# Patient Record
Sex: Female | Born: 1945 | Race: Black or African American | Hispanic: No | Marital: Married | Smoking: Never smoker
Health system: Southern US, Community
[De-identification: ages and names within clinical notes are randomized; demographics above are authoritative.]

## PROBLEM LIST (undated history)

## (undated) DIAGNOSIS — J449 Chronic obstructive pulmonary disease, unspecified: Secondary | ICD-10-CM

## (undated) DIAGNOSIS — Z9981 Dependence on supplemental oxygen: Secondary | ICD-10-CM

## (undated) DIAGNOSIS — E7800 Pure hypercholesterolemia, unspecified: Secondary | ICD-10-CM

## (undated) DIAGNOSIS — M109 Gout, unspecified: Secondary | ICD-10-CM

## (undated) DIAGNOSIS — K219 Gastro-esophageal reflux disease without esophagitis: Secondary | ICD-10-CM

## (undated) DIAGNOSIS — I10 Essential (primary) hypertension: Secondary | ICD-10-CM

## (undated) HISTORY — PX: CORNEAL TRANSPLANT: SHX108

## (undated) HISTORY — PX: SMALL INTESTINE SURGERY: SHX150

---

## 2012-05-30 ENCOUNTER — Encounter (HOSPITAL_BASED_OUTPATIENT_CLINIC_OR_DEPARTMENT_OTHER): Payer: Self-pay | Admitting: *Deleted

## 2012-05-30 ENCOUNTER — Emergency Department (HOSPITAL_BASED_OUTPATIENT_CLINIC_OR_DEPARTMENT_OTHER)
Admission: EM | Admit: 2012-05-30 | Discharge: 2012-05-30 | Disposition: A | Discharge status: Other Acute Inpatient Hospital | Payer: Medicare Other | Attending: Emergency Medicine | Admitting: Emergency Medicine

## 2012-05-30 ENCOUNTER — Emergency Department (HOSPITAL_BASED_OUTPATIENT_CLINIC_OR_DEPARTMENT_OTHER): Payer: Medicare Other

## 2012-05-30 DIAGNOSIS — R079 Chest pain, unspecified: Secondary | ICD-10-CM | POA: Insufficient documentation

## 2012-05-30 DIAGNOSIS — R059 Cough, unspecified: Secondary | ICD-10-CM | POA: Insufficient documentation

## 2012-05-30 DIAGNOSIS — J449 Chronic obstructive pulmonary disease, unspecified: Secondary | ICD-10-CM

## 2012-05-30 DIAGNOSIS — J441 Chronic obstructive pulmonary disease with (acute) exacerbation: Secondary | ICD-10-CM | POA: Insufficient documentation

## 2012-05-30 DIAGNOSIS — R0982 Postnasal drip: Secondary | ICD-10-CM | POA: Insufficient documentation

## 2012-05-30 DIAGNOSIS — R05 Cough: Secondary | ICD-10-CM | POA: Insufficient documentation

## 2012-05-30 DIAGNOSIS — I10 Essential (primary) hypertension: Secondary | ICD-10-CM | POA: Insufficient documentation

## 2012-05-30 DIAGNOSIS — Z79899 Other long term (current) drug therapy: Secondary | ICD-10-CM | POA: Insufficient documentation

## 2012-05-30 DIAGNOSIS — E7800 Pure hypercholesterolemia, unspecified: Secondary | ICD-10-CM | POA: Insufficient documentation

## 2012-05-30 DIAGNOSIS — Z9981 Dependence on supplemental oxygen: Secondary | ICD-10-CM | POA: Insufficient documentation

## 2012-05-30 DIAGNOSIS — J189 Pneumonia, unspecified organism: Secondary | ICD-10-CM

## 2012-05-30 DIAGNOSIS — R5381 Other malaise: Secondary | ICD-10-CM | POA: Insufficient documentation

## 2012-05-30 DIAGNOSIS — J3489 Other specified disorders of nose and nasal sinuses: Secondary | ICD-10-CM | POA: Insufficient documentation

## 2012-05-30 HISTORY — DX: Essential (primary) hypertension: I10

## 2012-05-30 HISTORY — DX: Gout, unspecified: M10.9

## 2012-05-30 HISTORY — DX: Pure hypercholesterolemia, unspecified: E78.00

## 2012-05-30 HISTORY — DX: Chronic obstructive pulmonary disease, unspecified: J44.9

## 2012-05-30 HISTORY — DX: Gastro-esophageal reflux disease without esophagitis: K21.9

## 2012-05-30 LAB — CBC WITH DIFFERENTIAL/PLATELET
Basophils Absolute: 0 10*3/uL (ref 0.0–0.1)
Lymphocytes Relative: 3 % — ABNORMAL LOW (ref 12–46)
Lymphs Abs: 0.5 10*3/uL — ABNORMAL LOW (ref 0.7–4.0)
Monocytes Relative: 4 % (ref 3–12)
Neutrophils Relative %: 87 % — ABNORMAL HIGH (ref 43–77)
Platelets: 366 10*3/uL (ref 150–400)
RBC: 4.49 MIL/uL (ref 3.87–5.11)
RDW: 15.4 % (ref 11.5–15.5)
WBC: 17.7 10*3/uL — ABNORMAL HIGH (ref 4.0–10.5)

## 2012-05-30 LAB — BASIC METABOLIC PANEL
Chloride: 99 mEq/L (ref 96–112)
Creatinine, Ser: 0.9 mg/dL (ref 0.50–1.10)
GFR calc Af Amer: 77 mL/min — ABNORMAL LOW (ref >90)
Potassium: 3.4 mEq/L — ABNORMAL LOW (ref 3.5–5.1)
Sodium: 144 mEq/L (ref 135–145)

## 2012-05-30 LAB — POCT I-STAT 3, ART BLOOD GAS (G3+)
Acid-Base Excess: 13 mmol/L — ABNORMAL HIGH (ref 0.0–2.0)
pH, Arterial: 7.462 — ABNORMAL HIGH (ref 7.350–7.450)
pO2, Arterial: 86 mmHg (ref 80.0–100.0)

## 2012-05-30 LAB — TROPONIN I: Troponin I: <0.3 ng/mL (ref <0.30)

## 2012-05-30 MED ORDER — LEVOFLOXACIN IN D5W 750 MG/150ML IV SOLN
750.0000 mg | Freq: Once | INTRAVENOUS | Status: AC
  Administered 2012-05-30: 750 mg via INTRAVENOUS
  Filled 2012-05-30: qty 150

## 2012-05-30 MED ORDER — IPRATROPIUM BROMIDE 0.02 % IN SOLN
0.5000 mg | Freq: Once | RESPIRATORY_TRACT | Status: AC
  Administered 2012-05-30: 0.5 mg via RESPIRATORY_TRACT
  Filled 2012-05-30: qty 2.5

## 2012-05-30 MED ORDER — METHYLPREDNISOLONE SODIUM SUCC 125 MG IJ SOLR
125.0000 mg | Freq: Once | INTRAMUSCULAR | Status: AC
  Administered 2012-05-30: 125 mg via INTRAVENOUS
  Filled 2012-05-30: qty 2

## 2012-05-30 MED ORDER — ALBUTEROL SULFATE (5 MG/ML) 0.5% IN NEBU
5.0000 mg | INHALATION_SOLUTION | Freq: Once | RESPIRATORY_TRACT | Status: AC
  Administered 2012-05-30: 5 mg via RESPIRATORY_TRACT
  Filled 2012-05-30: qty 1

## 2012-05-30 MED ORDER — SODIUM CHLORIDE 0.9 % IV SOLN
1000.0000 mL | INTRAVENOUS | Status: DC
  Administered 2012-05-30: 1000 mL via INTRAVENOUS

## 2012-05-30 NOTE — ED Provider Notes (Signed)
History     CSN: [id number]  Arrival date & time 05/30/12  1103   First MD Initiated Contact with Patient 05/30/12 1123      Chief Complaint  Patient presents with  . Respiratory Distress    (Consider location/radiation/quality/duration/timing/severity/associated sxs/prior treatment) HPI Comments: Patient with a history of COPD presents with respiratory distress. She states that she's been having some increased breathing difficulty over the last week has been worse over last 2 days. She's had some increased wheezing mostly at night. She's also had worsening cough with sputum productive of yellow and thick white sputum. She denies any fevers or chills. She states her center of her chest hurts when she's coughing but denies any other chest pain. She denies any pleuritic-type pain. She is on home oxygen at 2 L per minute. She also uses nebulizer machines at home which she has been using with no improvement of symptoms. She denies any leg pain or swelling. She denies any fevers or chills.   Past Medical History  Diagnosis Date  . Hypertension   . COPD (chronic obstructive pulmonary disease)   . High cholesterol     No past surgical history on file.  No family history on file.  History  Substance Use Topics  . Smoking status: Not on file  . Smokeless tobacco: Not on file  . Alcohol Use: Not on file    OB History   Grav Para Term Preterm Abortions TAB SAB Ect Mult Living                  Review of Systems  Constitutional: Positive for fatigue. Negative for fever, chills and diaphoresis.  HENT: Positive for congestion, rhinorrhea and postnasal drip. Negative for sneezing.   Eyes: Negative.   Respiratory: Positive for shortness of breath and wheezing. Negative for cough and chest tightness.   Cardiovascular: Positive for chest pain. Negative for leg swelling.  Gastrointestinal: Negative for nausea, vomiting, abdominal pain, diarrhea and blood in stool.  Genitourinary:  Negative for frequency, hematuria, flank pain and difficulty urinating.  Musculoskeletal: Negative for back pain and arthralgias.  Skin: Negative for rash.  Neurological: Negative for dizziness, speech difficulty, weakness, numbness and headaches.    Allergies  Sulfa antibiotics  Home Medications   Current Outpatient Rx  Name  Route  Sig  Dispense  Refill  . ALLOPURINOL PO   Oral   Take by mouth.         . ATORVASTATIN CALCIUM PO   Oral   Take by mouth.         . Budesonide-Formoterol Fumarate (SYMBICORT IN)   Inhalation   Inhale into the lungs.         Marland Kitchen CALCIUM PO   Oral   Take by mouth.         . cloNIDine (CATAPRES) 0.1 MG tablet   Oral   Take 0.1 mg by mouth 2 (two) times daily.         . furosemide (LASIX) 20 MG tablet   Oral   Take 20 mg by mouth 2 (two) times daily.         . montelukast (SINGULAIR) 10 MG tablet   Oral   Take 10 mg by mouth at bedtime.         . pantoprazole (PROTONIX) 40 MG tablet   Oral   Take 40 mg by mouth daily.         Marland Kitchen Phenylephrine-Chlorphen-DM 10-3.5-28 MG TABS   Oral  Take by mouth.         Marland Kitchen PREDNISONE PO   Oral   Take by mouth.           BP 172/84  Pulse 91  Temp(Src) 98.1 F (36.7 C) (Oral)  Resp 24  Ht 5\' 2"  (1.575 m)  Wt 155 lb (70.308 kg)  BMI 28.34 kg/m2  SpO2 95%  Physical Exam  Constitutional: She is oriented to person, place, and time. She appears well-developed and well-nourished.  HENT:  Head: Normocephalic and atraumatic.  Eyes: Pupils are equal, round, and reactive to light.  Neck: Normal range of motion. Neck supple.  Cardiovascular: Normal rate, regular rhythm and normal heart sounds.   Pulmonary/Chest: She is in respiratory distress (Some increased work of breathing and hypoxia.). She has wheezes (moderate diffuse expiratory wheezing with diminished breath sounds bilaterally). She has no rales. She exhibits no tenderness.  Abdominal: Soft. Bowel sounds are normal. There  is no tenderness. There is no rebound and no guarding.  Musculoskeletal: Normal range of motion. She exhibits no edema and no tenderness.  Lymphadenopathy:    She has no cervical adenopathy.  Neurological: She is alert and oriented to person, place, and time.  Skin: Skin is warm and dry. No rash noted.  Psychiatric: She has a normal mood and affect.    ED Course  Procedures (including critical care time)  Results for orders placed during the hospital encounter of 05/30/12  CBC WITH DIFFERENTIAL      Result Value Range   WBC 17.7 (*) 4.0 - 10.5 K/uL   RBC 4.49  3.87 - 5.11 MIL/uL   Hemoglobin 13.0  12.0 - 15.0 g/dL   HCT 09.8  11.9 - 14.7 %   MCV 91.8  78.0 - 100.0 fL   MCH 29.0  26.0 - 34.0 pg   MCHC 31.6  30.0 - 36.0 g/dL   RDW 82.9  56.2 - 13.0 %   Platelets 366  150 - 400 K/uL   Neutrophils Relative 87 (*) 43 - 77 %   Lymphocytes Relative 3 (*) 12 - 46 %   Monocytes Relative 4  3 - 12 %   Eosinophils Relative 6 (*) 0 - 5 %   Basophils Relative 0  0 - 1 %   Neutro Abs 15.4 (*) 1.7 - 7.7 K/uL   Lymphs Abs 0.5 (*) 0.7 - 4.0 K/uL   Monocytes Absolute 0.7  0.1 - 1.0 K/uL   Eosinophils Absolute 1.1 (*) 0.0 - 0.7 K/uL   Basophils Absolute 0.0  0.0 - 0.1 K/uL   RBC Morphology ELLIPTOCYTES    BASIC METABOLIC PANEL      Result Value Range   Sodium 144  135 - 145 mEq/L   Potassium 3.4 (*) 3.5 - 5.1 mEq/L   Chloride 99  96 - 112 mEq/L   CO2 37 (*) 19 - 32 mEq/L   Glucose, Bld 117 (*) 70 - 99 mg/dL   BUN 26 (*) 6 - 23 mg/dL   Creatinine, Ser 8.65  0.50 - 1.10 mg/dL   Calcium 78.4  8.4 - 69.6 mg/dL   GFR calc non Af Amer 66 (*) >90 mL/min   GFR calc Af Amer 77 (*) >90 mL/min   Dg Chest 2 View  05/30/2012  *RADIOLOGY REPORT*  Clinical Data: Cough, wheezing.  CHEST - 2 VIEW  Comparison: None  Findings: There is cardiomegaly.  Bilateral patchy airspace opacities are noted in the lungs.  Mild hyperinflation.  Small  right pleural effusion.  No acute bony abnormality.  IMPRESSION:  Cardiomegaly with patchy bilateral airspace disease, edema versus infection.  Suspect underlying COPD.  Small right effusion.   Original Report Authenticated By: Charlett Nose, M.D.       Date: 05/30/2012  Rate: 83  Rhythm: normal sinus rhythm  QRS Axis: normal  Intervals: normal  ST/T Wave abnormalities: nonspecific ST/T changes  Conduction Disutrbances:none  Narrative Interpretation:   Old EKG Reviewed: none available   1. Community acquired pneumonia   2. COPD (chronic obstructive pulmonary disease)       MDM  Patient was given a DuoNeb treatment. She was also given a dose of steroids. Her breathing is markedly improved and she has no increased work of breathing. She's talking in full sentences. She is maintaining oxygen saturations in the mid 90s on a nasal cannula she currently is on at home. She does have evidence of pneumonia on her chest x-ray was given a dose of Levaquin for community-acquired pneumonia. She has no recent hospitalizations. I do feel that given her underlying lung disease with bilateral pneumonia she should be admitted for IV antibiotics. Her choice is to go [facility] and I did consult with the hospitalist at that facility who will accept the patient for transfer.        Rolan Bucco, MD 05/30/12 1319

## 2012-05-30 NOTE — ED Notes (Signed)
Sob and diff breathing for a week but worse x 2 days. Cough with yellow sputum. She is on oxygen 2/l [place].

## 2012-11-03 ENCOUNTER — Emergency Department (HOSPITAL_BASED_OUTPATIENT_CLINIC_OR_DEPARTMENT_OTHER): Payer: Medicare Other

## 2012-11-03 ENCOUNTER — Encounter (HOSPITAL_BASED_OUTPATIENT_CLINIC_OR_DEPARTMENT_OTHER): Payer: Self-pay | Admitting: Student

## 2012-11-03 ENCOUNTER — Emergency Department (HOSPITAL_BASED_OUTPATIENT_CLINIC_OR_DEPARTMENT_OTHER)
Admission: EM | Admit: 2012-11-03 | Discharge: 2012-11-03 | Disposition: A | Discharge status: Home / Self Care | Payer: Medicare Other | Attending: Emergency Medicine | Admitting: Emergency Medicine

## 2012-11-03 DIAGNOSIS — Z79899 Other long term (current) drug therapy: Secondary | ICD-10-CM | POA: Insufficient documentation

## 2012-11-03 DIAGNOSIS — M109 Gout, unspecified: Secondary | ICD-10-CM | POA: Insufficient documentation

## 2012-11-03 DIAGNOSIS — J4489 Other specified chronic obstructive pulmonary disease: Secondary | ICD-10-CM | POA: Insufficient documentation

## 2012-11-03 DIAGNOSIS — K625 Hemorrhage of anus and rectum: Secondary | ICD-10-CM | POA: Insufficient documentation

## 2012-11-03 DIAGNOSIS — K219 Gastro-esophageal reflux disease without esophagitis: Secondary | ICD-10-CM | POA: Insufficient documentation

## 2012-11-03 DIAGNOSIS — I10 Essential (primary) hypertension: Secondary | ICD-10-CM | POA: Insufficient documentation

## 2012-11-03 DIAGNOSIS — J449 Chronic obstructive pulmonary disease, unspecified: Secondary | ICD-10-CM | POA: Insufficient documentation

## 2012-11-03 DIAGNOSIS — E7800 Pure hypercholesterolemia, unspecified: Secondary | ICD-10-CM | POA: Insufficient documentation

## 2012-11-03 LAB — OCCULT BLOOD X 1 CARD TO LAB, STOOL: Fecal Occult Bld: NEGATIVE

## 2012-11-03 LAB — BASIC METABOLIC PANEL
Chloride: 102 mEq/L (ref 96–112)
GFR calc Af Amer: 88 mL/min — ABNORMAL LOW (ref >90)
Potassium: 3.4 mEq/L — ABNORMAL LOW (ref 3.5–5.1)

## 2012-11-03 LAB — CBC
HCT: 31.9 % — ABNORMAL LOW (ref 36.0–46.0)
Platelets: 265 10*3/uL (ref 150–400)
RBC: 3.33 MIL/uL — ABNORMAL LOW (ref 3.87–5.11)
RDW: 14.4 % (ref 11.5–15.5)
WBC: 11 10*3/uL — ABNORMAL HIGH (ref 4.0–10.5)

## 2012-11-03 MED ORDER — IOHEXOL 300 MG/ML  SOLN
100.0000 mL | Freq: Once | INTRAMUSCULAR | Status: AC | PRN
  Administered 2012-11-03: 100 mL via INTRAVENOUS

## 2012-11-03 MED ORDER — IOHEXOL 300 MG/ML  SOLN
50.0000 mL | Freq: Once | INTRAMUSCULAR | Status: AC | PRN
  Administered 2012-11-03: 50 mL via ORAL

## 2012-11-03 NOTE — ED Notes (Signed)
Wheelchair to lobby.

## 2012-11-03 NOTE — ED Notes (Signed)
Bright red blood mixed in stool and reports next BM was bloody. Reports cramping in lower abdominal region.

## 2012-11-03 NOTE — ED Notes (Signed)
Pt oob to restroom with assist x 1. gait steady, ambulates well.

## 2012-11-03 NOTE — ED Provider Notes (Signed)
CSN: [id number]     Arrival date & time 11/03/12  1305 History   First MD Initiated Contact with Patient 11/03/12 1327     Chief Complaint  Patient presents with  . Rectal Bleeding   (Consider location/radiation/quality/duration/timing/severity/associated sxs/prior Treatment) Patient is a 67 y.o. female presenting with hematochezia. The history is provided by the patient.  Rectal Bleeding Quality:  Bright red Amount:  Moderate (2 episodes yesterday, one with small amount, one with moderate , all yesterday, none today) Timing:  Sporadic Progression:  Resolved Chronicity:  New Context: not constipation, not defecation, not foreign body, not hemorrhoids, not rectal injury, not rectal pain and not spontaneously   Similar prior episodes: no   Relieved by:  Nothing Worsened by:  Nothing tried Ineffective treatments:  None tried Associated symptoms: abdominal pain (mild. occasional, crampy)   Associated symptoms: no dizziness, no epistaxis, no fever and no vomiting   Risk factors: no anticoagulant use     Past Medical History  Diagnosis Date  . Hypertension   . COPD (chronic obstructive pulmonary disease)   . High cholesterol   . Gout   . GERD (gastroesophageal reflux disease)    History reviewed. No pertinent past surgical history. No family history on file. History  Substance Use Topics  . Smoking status: Never Smoker   . Smokeless tobacco: Not on file  . Alcohol Use: No   OB History   Grav Para Term Preterm Abortions TAB SAB Ect Mult Living                 Review of Systems  Constitutional: Negative for fever.  HENT: Negative for nosebleeds.   Respiratory: Negative for cough and shortness of breath.   Gastrointestinal: Positive for abdominal pain (mild. occasional, crampy) and hematochezia. Negative for vomiting.  Neurological: Negative for dizziness.  All other systems reviewed and are negative.    Allergies  Sulfa antibiotics  Home Medications   Current  Outpatient Rx  Name  Route  Sig  Dispense  Refill  . ALLOPURINOL PO   Oral   Take by mouth.         . ATORVASTATIN CALCIUM PO   Oral   Take by mouth.         . Budesonide-Formoterol Fumarate (SYMBICORT IN)   Inhalation   Inhale into the lungs.         Marland Kitchen CALCIUM PO   Oral   Take by mouth.         . cloNIDine (CATAPRES) 0.1 MG tablet   Oral   Take 0.1 mg by mouth 2 (two) times daily.         . furosemide (LASIX) 20 MG tablet   Oral   Take 20 mg by mouth 2 (two) times daily.         . montelukast (SINGULAIR) 10 MG tablet   Oral   Take 10 mg by mouth at bedtime.         . pantoprazole (PROTONIX) 40 MG tablet   Oral   Take 40 mg by mouth daily.         Marland Kitchen Phenylephrine-Chlorphen-DM 10-3.5-28 MG TABS   Oral   Take by mouth.         Marland Kitchen PREDNISONE PO   Oral   Take by mouth.          BP 166/72  Pulse 87  Temp(Src) 98.5 F (36.9 C) (Oral)  Resp 22  Wt 161 lb (73.029 kg)  BMI 29.44  kg/m2  SpO2 94% Physical Exam  Nursing note and vitals reviewed. Constitutional: She is oriented to person, place, and time. She appears well-developed and well-nourished. No distress.  HENT:  Head: Normocephalic and atraumatic.  Eyes: EOM are normal. Pupils are equal, round, and reactive to light.  Neck: Normal range of motion. Neck supple.  Cardiovascular: Normal rate and regular rhythm.  Exam reveals no friction rub.   No murmur heard. Pulmonary/Chest: Effort normal. No respiratory distress. She has wheezes (diffuse). She has no rales.  Abdominal: Soft. She exhibits no distension. There is no tenderness. There is no rebound.  Musculoskeletal: Normal range of motion. She exhibits no edema.  Neurological: She is alert and oriented to person, place, and time.  Skin: She is not diaphoretic.    ED Course  Procedures (including critical care time) Labs Review Labs Reviewed  CBC - Abnormal; Notable for the following:    WBC 11.0 (*)    RBC 3.33 (*)    Hemoglobin  9.8 (*)    HCT 31.9 (*)    All other components within normal limits  BASIC METABOLIC PANEL - Abnormal; Notable for the following:    Potassium 3.4 (*)    CO2 38 (*)    Glucose, Bld 135 (*)    GFR calc non Af Amer 76 (*)    GFR calc Af Amer 88 (*)    All other components within normal limits  OCCULT BLOOD X 1 CARD TO LAB, STOOL  CG4 I-STAT (LACTIC ACID)   Imaging Review Ct Abdomen Pelvis W Contrast  11/03/2012   CLINICAL DATA:  Bright red bleeding from the rectum. History of colon cancer. Abdominal pain and cramping.  EXAM: CT ABDOMEN AND PELVIS WITH CONTRAST  TECHNIQUE: Multidetector CT imaging of the abdomen and pelvis was performed using the standard protocol following bolus administration of intravenous contrast.  CONTRAST:  OMNIPAQUE IOHEXOL 300 MG/ML SOLN, 50mL OMNIPAQUE IOHEXOL 300 MG/ML SOLN  COMPARISON:  CT chest 05/31/2012.  FINDINGS: And bronchiectatic changes and micronodular disease is again noted at both lung bases. No new nodules or significant change is evident. Fibrotic changes are evident. The heart size is normal. No significant pleural or pericardial effusion is present.  The liver and spleen are within normal limits. The stomach, duodenum, and pancreas are within normal limits. The common bile duct and gallbladder are unremarkable. The adrenal glands are normal bilaterally. A nonobstructing 5.3 mm stone is evident at the lower pole of the right kidney. And the left kidney is unremarkable. The ureters are within normal limits bilaterally.  The rectosigmoid colon is within normal limits. Moderate stool is present in the transverse colon without focal obstruction. The ascending colon is unremarkable. The midtransverse colon is noted to the immediately adjacent to the ventral wall of the abdomen subjacent to the normal like is. The patient is status post midline laparotomy. The appendix is not visualized and may be surgically absent. The small bowel is unremarkable. The uterus  and adnexa are within normal limits for age. There is no significant adenopathy or free fluid.  Degenerative changes are noted in the SI joints bilaterally. Mild facet degenerative changes are noted in the lumbar spine. No focal lytic or blastic lesions are evident.  IMPRESSION: 1. No acute or focal abnormality to explain the patient's symptoms. 2. Transverse colon is E medial lease subjacent to the emboli kiss. Adhesions are likely. There is no evidence for obstruction. 3. Nonobstructing 5.3 mm stone at the lower pole of the  right kidney. 4. Degenerative changes of the SI joints bilaterally.   Electronically Signed   By: Gennette Pac M.D.   On: 11/03/2012 15:34    MDM   1. Rectal bleeding     67 year old female with history of COPD presents with rectal bleeding. 2 episodes of bloody bowel movements, one yesterday morning with mild amount blood, second one yesterday afternoon with moderate amount of blood without stool. Has resolved and has no further bleeding today. Had normal bowel movement the blood today. She is feeling well, no dizziness, no chest pain, shortness of breath. She's had some mild crampy intermittent abdominal pain, not right now. Her vitals are stable. Chest diffuse wheezes on oxygen at baseline. She has no abdominal pain. Labs will be checked. She is Hemoccult negative. She has no frank blood on rectal exam.  Labs show hemoglobin 9.8. Last in the system is 13. Platelets are normal. A small white count 11. CT shows no acute abnormality. I spoke with the hospitalist [facility] who stated that patient hemoglobin was 10 2 months ago upon admission. Her hemoglobin seems to be at baseline. Patient stable for discharge since her hematochezia has resolved at this time.     Dagmar Hait, MD 11/03/12 [phone]

## 2013-05-10 ENCOUNTER — Other Ambulatory Visit: Payer: Self-pay

## 2013-05-10 ENCOUNTER — Emergency Department (HOSPITAL_BASED_OUTPATIENT_CLINIC_OR_DEPARTMENT_OTHER)
Admission: EM | Admit: 2013-05-10 | Discharge: 2013-05-10 | Disposition: A | Discharge status: Home / Self Care | Payer: Medicare Other | Attending: Emergency Medicine | Admitting: Emergency Medicine

## 2013-05-10 ENCOUNTER — Emergency Department (HOSPITAL_BASED_OUTPATIENT_CLINIC_OR_DEPARTMENT_OTHER): Payer: Medicare Other

## 2013-05-10 ENCOUNTER — Encounter (HOSPITAL_BASED_OUTPATIENT_CLINIC_OR_DEPARTMENT_OTHER): Payer: Self-pay | Admitting: Emergency Medicine

## 2013-05-10 DIAGNOSIS — M109 Gout, unspecified: Secondary | ICD-10-CM | POA: Insufficient documentation

## 2013-05-10 DIAGNOSIS — Z9981 Dependence on supplemental oxygen: Secondary | ICD-10-CM | POA: Insufficient documentation

## 2013-05-10 DIAGNOSIS — I10 Essential (primary) hypertension: Secondary | ICD-10-CM | POA: Insufficient documentation

## 2013-05-10 DIAGNOSIS — E7800 Pure hypercholesterolemia, unspecified: Secondary | ICD-10-CM | POA: Insufficient documentation

## 2013-05-10 DIAGNOSIS — IMO0002 Reserved for concepts with insufficient information to code with codable children: Secondary | ICD-10-CM | POA: Insufficient documentation

## 2013-05-10 DIAGNOSIS — R000 Tachycardia, unspecified: Secondary | ICD-10-CM | POA: Insufficient documentation

## 2013-05-10 DIAGNOSIS — Z79899 Other long term (current) drug therapy: Secondary | ICD-10-CM | POA: Insufficient documentation

## 2013-05-10 DIAGNOSIS — J441 Chronic obstructive pulmonary disease with (acute) exacerbation: Secondary | ICD-10-CM

## 2013-05-10 DIAGNOSIS — K219 Gastro-esophageal reflux disease without esophagitis: Secondary | ICD-10-CM | POA: Insufficient documentation

## 2013-05-10 MED ORDER — ALBUTEROL SULFATE (2.5 MG/3ML) 0.083% IN NEBU
INHALATION_SOLUTION | RESPIRATORY_TRACT | Status: AC
  Administered 2013-05-10: 2.5 mg
  Filled 2013-05-10: qty 3

## 2013-05-10 MED ORDER — PREDNISONE 20 MG PO TABS: 60.0000 mg | ORAL_TABLET | Freq: Every day | ORAL | Status: AC

## 2013-05-10 MED ORDER — IPRATROPIUM BROMIDE 0.02 % IN SOLN
0.5000 mg | Freq: Once | RESPIRATORY_TRACT | Status: AC
  Administered 2013-05-10: 0.5 mg via RESPIRATORY_TRACT
  Filled 2013-05-10: qty 2.5

## 2013-05-10 MED ORDER — PREDNISONE 50 MG PO TABS
60.0000 mg | ORAL_TABLET | Freq: Once | ORAL | Status: AC
  Administered 2013-05-10: 18:00:00 60 mg via ORAL
  Filled 2013-05-10 (×2): qty 1

## 2013-05-10 MED ORDER — ALBUTEROL (5 MG/ML) CONTINUOUS INHALATION SOLN
10.0000 mg/h | INHALATION_SOLUTION | Freq: Once | RESPIRATORY_TRACT | Status: AC
  Administered 2013-05-10: 10 mg/h via RESPIRATORY_TRACT
  Filled 2013-05-10: qty 20

## 2013-05-10 MED ORDER — IPRATROPIUM-ALBUTEROL 0.5-2.5 (3) MG/3ML IN SOLN
RESPIRATORY_TRACT | Status: AC
  Administered 2013-05-10: 3 mL
  Filled 2013-05-10: qty 3

## 2013-05-10 NOTE — ED Provider Notes (Signed)
CSN: [id number]     Arrival date & time 05/10/13  1633 History   First MD Initiated Contact with Patient 05/10/13 1659     Chief Complaint  Patient presents with  . Shortness of Breath     (Consider location/radiation/quality/duration/timing/severity/associated sxs/prior Treatment) Patient is a 68 y.o. female presenting with shortness of breath. The history is provided by the patient.  Shortness of Breath Severity:  Mild Onset quality:  Gradual Timing:  Constant Progression:  Worsening Chronicity:  Recurrent Context: pollens   Relieved by:  Nothing Worsened by:  Nothing tried Ineffective treatments:  Inhaler Associated symptoms: cough (clear, similar level of production)   Associated symptoms: no abdominal pain, no chest pain, no fever and no sore throat     Past Medical History  Diagnosis Date  . Hypertension   . COPD (chronic obstructive pulmonary disease)   . High cholesterol   . Gout   . GERD (gastroesophageal reflux disease)    Past Surgical History  Procedure Laterality Date  . Small intestine surgery    . Corneal transplant      X3   History reviewed. No pertinent family history. History  Substance Use Topics  . Smoking status: Never Smoker   . Smokeless tobacco: Not on file  . Alcohol Use: No   OB History   Grav Para Term Preterm Abortions TAB SAB Ect Mult Living                 Review of Systems  Constitutional: Negative for fever.  HENT: Negative for sore throat.   Respiratory: Positive for cough (clear, similar level of production) and shortness of breath.   Cardiovascular: Negative for chest pain.  Gastrointestinal: Negative for abdominal pain.  All other systems reviewed and are negative.     Allergies  Sulfa antibiotics  Home Medications   Current Outpatient Rx  Name  Route  Sig  Dispense  Refill  . ALLOPURINOL PO   Oral   Take by mouth.         . ATORVASTATIN CALCIUM PO   Oral   Take by mouth.         .  Budesonide-Formoterol Fumarate (SYMBICORT IN)   Inhalation   Inhale into the lungs.         Marland Kitchen CALCIUM PO   Oral   Take by mouth.         . cloNIDine (CATAPRES) 0.1 MG tablet   Oral   Take 0.1 mg by mouth 2 (two) times daily.         . furosemide (LASIX) 20 MG tablet   Oral   Take 20 mg by mouth 2 (two) times daily.         . montelukast (SINGULAIR) 10 MG tablet   Oral   Take 10 mg by mouth at bedtime.         . pantoprazole (PROTONIX) 40 MG tablet   Oral   Take 40 mg by mouth daily.         Marland Kitchen Phenylephrine-Chlorphen-DM 10-3.5-28 MG TABS   Oral   Take by mouth.         Marland Kitchen PREDNISONE PO   Oral   Take by mouth.          BP 163/68  Pulse 112  Temp(Src) 98.6 F (37 C) (Oral)  Resp 22  Ht 5' 2.5" (1.588 m)  Wt 165 lb (74.844 kg)  BMI 29.68 kg/m2  SpO2 99% Physical Exam  Nursing note and  vitals reviewed. Constitutional: She is oriented to person, place, and time. She appears well-developed and well-nourished. No distress.  HENT:  Head: Normocephalic and atraumatic.  Eyes: EOM are normal. Pupils are equal, round, and reactive to light.  Neck: Normal range of motion. Neck supple.  Cardiovascular: Regular rhythm.  Tachycardia present.  Exam reveals no friction rub.   No murmur heard. Pulmonary/Chest: She is in respiratory distress (mild). She has decreased breath sounds (diffuse). She has wheezes (diffuse). She has no rales.  Abdominal: Soft. She exhibits no distension. There is no tenderness. There is no rebound.  Musculoskeletal: Normal range of motion. She exhibits no edema.  Neurological: She is alert and oriented to person, place, and time.  Skin: She is not diaphoretic.    ED Course  Procedures (including critical care time) Labs Review Labs Reviewed - No data to display Imaging Review No results found.   EKG Interpretation None      Date: 05/10/2013  Rate: 107  Rhythm: sinus tachycardia  QRS Axis: normal  Intervals: normal  ST/T  Wave abnormalities: normal  Conduction Disutrbances:none  Narrative Interpretation:   Old EKG Reviewed: unchanged    MDM   Final diagnoses:  COPD exacerbation    68 year old female with history of COPD presents with shortness of breath. She thinks it secondary to the pollen been in the air at this time. Increasing bleeding worsening shortness of breath since yesterday. Here on 3 L with normal hypoxia. On 3 L of oxygen chronically at home. She has bad COPD with recent admission for pneumonia and is on 5 mg daily prednisone. Here she is talking complete sentences. Lungs with diffuse moderate wheezing. Will begin with hour-long albuterol treatment, steroids. Much improved after steroids, feeling better. Satting well still on 3L baseline. CXR clear. Stable for discharge, given steroids. Has albuterol at home.   Dagmar HaitWilliam Xzavier Swinger, MD 05/11/13 0000

## 2013-05-10 NOTE — Discharge Instructions (Signed)
Chronic Obstructive Pulmonary Disease Exacerbation °Chronic obstructive pulmonary disease (COPD) is a common lung condition in which airflow from the lungs is limited. COPD is a general term that can be used to describe many different lung problems that limit airflow, including chronic bronchitis and emphysema. COPD exacerbations are episodes when breathing symptoms become much worse and require extra treatment. Without treatment, COPD exacerbations can be life threatening, and frequent COPD exacerbations can cause further damage to your lungs. °CAUSES  °· Respiratory infections.   °· Exposure to smoke.   °· Exposure to air pollution, chemical fumes, or dust. °Sometimes there is no apparent cause or trigger. °RISK FACTORS °· Smoking cigarettes. °· Older age. °· Frequent prior COPD exacerbations. °SIGNS AND SYMPTOMS  °· Increased coughing.   °· Increased thick spit (sputum) production.   °· Increased wheezing.   °· Increased shortness of breath.   °· Rapid breathing.   °· Chest tightness. °DIAGNOSIS  °Your medical history, a physical exam, and tests will help your health care provider make a diagnosis. Tests may include: °· A chest X-ray. °· Basic lab tests. °· Sputum testing. °· An arterial blood gas test. °TREATMENT  °Depending on the severity of your COPD exacerbation, you may need to be admitted to a hospital for treatment. Some of the treatments commonly used to treat COPD exacerbations are:  °· Antibiotic medicines.   °· Bronchodilators. These are drugs that expand the air passages. They may be given with an inhaler or nebulizer. Spacer devices may be needed to help improve drug delivery. °· Corticosteroid medicines. °· Supplemental oxygen therapy.   °HOME CARE INSTRUCTIONS  °· Do not smoke. Quitting smoking is very important to prevent COPD from getting worse and exacerbations from happening as often. °· Avoid exposure to all substances that irritate the airway, especially to tobacco smoke.   °· If prescribed,  take your antibiotics as directed. Finish them even if you start to feel better. °· Only take over-the-counter or prescription medicines as directed by your health care provider. It is important to use correct technique with inhaled medicines. °· Drink enough fluids to keep your urine clear or pale yellow (unless you have a medical condition that requires fluid restriction). °· Use a cool mist vaporizer. This makes it easier to clear your chest when you cough.   °· If you have a home nebulizer and oxygen, continue to use them as directed.   °· Maintain all necessary vaccinations to prevent infections.   °· Exercise regularly.   °· Eat a healthy diet.   °· Keep all follow-up appointments as directed by your health care provider. °SEEK IMMEDIATE MEDICAL CARE IF: °· You have worsening shortness of breath.   °· You have trouble talking.   °· You have severe chest pain. °· You have blood in your sputum.  °· You have a fever. °· You have weakness, vomit repeatedly, or faint.   °· You feel confused.   °· You continue to get worse. °MAKE SURE YOU:  °· Understand these instructions. °· Will watch your condition. °· Will get help right away if you are not doing well or get worse. °Document Released: 11/15/2006 Document Revised: 11/08/2012 Document Reviewed: 09/22/2012 °[employer]® Patient Information ©2014 [employer]. ° °

## 2013-05-10 NOTE — ED Notes (Signed)
MD at bedside. 

## 2013-05-10 NOTE — ED Notes (Signed)
Pt states increased SOB since last night. Pt states pollen is "getting to me". Pt COPD history. Has used rescue inhaler and nebs without relief

## 2013-07-21 ENCOUNTER — Emergency Department (HOSPITAL_BASED_OUTPATIENT_CLINIC_OR_DEPARTMENT_OTHER)
Admission: EM | Admit: 2013-07-21 | Discharge: 2013-07-21 | Payer: Medicare Other | Attending: Emergency Medicine | Admitting: Emergency Medicine

## 2013-07-21 ENCOUNTER — Emergency Department (HOSPITAL_BASED_OUTPATIENT_CLINIC_OR_DEPARTMENT_OTHER): Payer: Medicare Other

## 2013-07-21 ENCOUNTER — Encounter (HOSPITAL_BASED_OUTPATIENT_CLINIC_OR_DEPARTMENT_OTHER): Payer: Self-pay | Admitting: Emergency Medicine

## 2013-07-21 DIAGNOSIS — R0609 Other forms of dyspnea: Secondary | ICD-10-CM | POA: Diagnosis present

## 2013-07-21 DIAGNOSIS — IMO0002 Reserved for concepts with insufficient information to code with codable children: Secondary | ICD-10-CM | POA: Insufficient documentation

## 2013-07-21 DIAGNOSIS — Z9981 Dependence on supplemental oxygen: Secondary | ICD-10-CM | POA: Insufficient documentation

## 2013-07-21 DIAGNOSIS — Z79899 Other long term (current) drug therapy: Secondary | ICD-10-CM | POA: Diagnosis not present

## 2013-07-21 DIAGNOSIS — I10 Essential (primary) hypertension: Secondary | ICD-10-CM | POA: Insufficient documentation

## 2013-07-21 DIAGNOSIS — K219 Gastro-esophageal reflux disease without esophagitis: Secondary | ICD-10-CM | POA: Insufficient documentation

## 2013-07-21 DIAGNOSIS — J441 Chronic obstructive pulmonary disease with (acute) exacerbation: Secondary | ICD-10-CM

## 2013-07-21 DIAGNOSIS — M109 Gout, unspecified: Secondary | ICD-10-CM | POA: Diagnosis not present

## 2013-07-21 HISTORY — DX: Dependence on supplemental oxygen: Z99.81

## 2013-07-21 LAB — BASIC METABOLIC PANEL
BUN: 16 mg/dL (ref 6–23)
CO2: 39 mEq/L — ABNORMAL HIGH (ref 19–32)
CREATININE: 1 mg/dL (ref 0.50–1.10)
Calcium: 9.7 mg/dL (ref 8.4–10.5)
Chloride: 97 mEq/L (ref 96–112)
GFR, EST AFRICAN AMERICAN: 67 mL/min — AB (ref >90)
GFR, EST NON AFRICAN AMERICAN: 57 mL/min — AB (ref >90)
Glucose, Bld: 148 mg/dL — ABNORMAL HIGH (ref 70–99)
Potassium: 3.9 mEq/L (ref 3.7–5.3)
Sodium: 145 mEq/L (ref 137–147)

## 2013-07-21 LAB — CBC
HEMATOCRIT: 32.7 % — AB (ref 36.0–46.0)
Hemoglobin: 9.8 g/dL — ABNORMAL LOW (ref 12.0–15.0)
MCH: 29.5 pg (ref 26.0–34.0)
MCHC: 30 g/dL (ref 30.0–36.0)
MCV: 98.5 fL (ref 78.0–100.0)
Platelets: 262 10*3/uL (ref 150–400)
RBC: 3.32 MIL/uL — ABNORMAL LOW (ref 3.87–5.11)
RDW: 14 % (ref 11.5–15.5)
WBC: 11.4 10*3/uL — ABNORMAL HIGH (ref 4.0–10.5)

## 2013-07-21 LAB — TROPONIN I: Troponin I: <0.3 ng/mL (ref <0.30)

## 2013-07-21 LAB — PRO B NATRIURETIC PEPTIDE: Pro B Natriuretic peptide (BNP): 317.6 pg/mL — ABNORMAL HIGH (ref 0–125)

## 2013-07-21 LAB — I-STAT ARTERIAL BLOOD GAS, ED
Acid-Base Excess: 14 mmol/L — ABNORMAL HIGH (ref 0.0–2.0)
Bicarbonate: 42.7 mEq/L — ABNORMAL HIGH (ref 20.0–24.0)
O2 Saturation: 96 %
PO2 ART: 89 mmHg (ref 80.0–100.0)
TCO2: 45 mmol/L (ref 0–100)
pCO2 arterial: 75.7 mmHg (ref 35.0–45.0)
pH, Arterial: 7.358 (ref 7.350–7.450)

## 2013-07-21 MED ORDER — IPRATROPIUM-ALBUTEROL 0.5-2.5 (3) MG/3ML IN SOLN
RESPIRATORY_TRACT | Status: AC
  Filled 2013-07-21: qty 3

## 2013-07-21 MED ORDER — PREDNISONE 50 MG PO TABS
50.0000 mg | ORAL_TABLET | Freq: Once | ORAL | Status: AC
  Administered 2013-07-21: 50 mg via ORAL
  Filled 2013-07-21: qty 1

## 2013-07-21 MED ORDER — IPRATROPIUM-ALBUTEROL 0.5-2.5 (3) MG/3ML IN SOLN
3.0000 mL | Freq: Once | RESPIRATORY_TRACT | Status: AC
  Administered 2013-07-21: 3 mL via RESPIRATORY_TRACT

## 2013-07-21 MED ORDER — ASPIRIN 325 MG PO TABS
325.0000 mg | ORAL_TABLET | ORAL | Status: AC
  Administered 2013-07-21: 325 mg via ORAL
  Filled 2013-07-21: qty 1

## 2013-07-21 MED ORDER — FENTANYL CITRATE 0.05 MG/ML IJ SOLN
100.0000 ug | Freq: Once | INTRAMUSCULAR | Status: AC
  Administered 2013-07-21: 100 ug via INTRAVENOUS
  Filled 2013-07-21: qty 2

## 2013-07-21 MED ORDER — ALBUTEROL (5 MG/ML) CONTINUOUS INHALATION SOLN
10.0000 mg/h | INHALATION_SOLUTION | RESPIRATORY_TRACT | Status: DC
  Administered 2013-07-21: 10 mg/h via RESPIRATORY_TRACT
  Filled 2013-07-21: qty 20

## 2013-07-21 MED ORDER — ALBUTEROL SULFATE (2.5 MG/3ML) 0.083% IN NEBU
INHALATION_SOLUTION | RESPIRATORY_TRACT | Status: AC
  Filled 2013-07-21: qty 3

## 2013-07-21 MED ORDER — IOHEXOL 350 MG/ML SOLN
100.0000 mL | Freq: Once | INTRAVENOUS | Status: AC | PRN
  Administered 2013-07-21: 100 mL via INTRAVENOUS

## 2013-07-21 MED ORDER — ALBUTEROL SULFATE (2.5 MG/3ML) 0.083% IN NEBU
2.5000 mg | INHALATION_SOLUTION | Freq: Once | RESPIRATORY_TRACT | Status: AC
  Administered 2013-07-21: 2.5 mg via RESPIRATORY_TRACT

## 2013-07-21 NOTE — ED Notes (Signed)
Pt awoke this morning with worsened SOB and right sided chest pain. "Got better then got worse again". Arrives to ED with notable dyspnea at rest, wear 4L O2 (normally 3L), sitting upright, short answers to questions.

## 2013-07-21 NOTE — ED Provider Notes (Signed)
CSN: [id number]     Arrival date & time 07/21/13  1342 History   First MD Initiated Contact with Patient 07/21/13 1402     Chief Complaint  Patient presents with  . Respiratory Distress     (Consider location/radiation/quality/duration/timing/severity/associated sxs/prior Treatment) HPI 68 year old female with COPD, home oxygen at 3 L, chronic steroid use recently decreased to 10 mg daily, states over the last few days she has had a cough worse than baseline and started taking Zithromax prescribed by her lung doctor, she's had no fever but has had 2 days of a sharp stabbing well localized constant nonradiating chest pain worse when she lies on her right side and worse with coughing, she also is worse than baseline shortness of breath today leading to the emergency department visit, she has no abdominal pain no confusion no other treatment prior to arrival and no other concerns. At baseline she has some mild edema to her legs and that has not changed recently. Past Medical History  Diagnosis Date  . Hypertension   . COPD (chronic obstructive pulmonary disease)   . High cholesterol   . Gout   . GERD (gastroesophageal reflux disease)   . On home O2     normally 3L   Past Surgical History  Procedure Laterality Date  . Small intestine surgery    . Corneal transplant      X3   History reviewed. No pertinent family history. History  Substance Use Topics  . Smoking status: Never Smoker   . Smokeless tobacco: Not on file  . Alcohol Use: No   OB History   Grav Para Term Preterm Abortions TAB SAB Ect Mult Living                 Review of Systems 10 Systems reviewed and are negative for acute change except as noted in the HPI.   Allergies  Sulfa antibiotics  Home Medications   Prior to Admission medications   Medication Sig Start Date End Date Taking? Authorizing Provider  ALLOPURINOL PO Take by mouth.    Historical Provider, MD  ATORVASTATIN CALCIUM PO Take by mouth.     Historical Provider, MD  Budesonide-Formoterol Fumarate (SYMBICORT IN) Inhale into the lungs.    Historical Provider, MD  CALCIUM PO Take by mouth.    Historical Provider, MD  cloNIDine (CATAPRES) 0.1 MG tablet Take 0.1 mg by mouth 2 (two) times daily.    Historical Provider, MD  furosemide (LASIX) 20 MG tablet Take 20 mg by mouth 2 (two) times daily.    Historical Provider, MD  montelukast (SINGULAIR) 10 MG tablet Take 10 mg by mouth at bedtime.    Historical Provider, MD  pantoprazole (PROTONIX) 40 MG tablet Take 40 mg by mouth daily.    Historical Provider, MD  Phenylephrine-Chlorphen-DM 10-3.5-28 MG TABS Take by mouth.    Historical Provider, MD  predniSONE (DELTASONE) 20 MG tablet Take 3 tablets (60 mg total) by mouth daily. 05/10/13   Dagmar HaitWilliam Blair Walden, MD  PREDNISONE PO Take by mouth.    Historical Provider, MD   BP 174/69  Pulse 110  Temp(Src) 99.2 F (37.3 C) (Oral)  Resp 24  SpO2 100% Physical Exam  Nursing note and vitals reviewed. Constitutional:  Awake, alert, nontoxic appearance.  HENT:  Head: Atraumatic.  Eyes: Right eye exhibits no discharge. Left eye exhibits no discharge.  Neck: Neck supple.  Cardiovascular: Normal rate and regular rhythm.   No murmur heard. Pulmonary/Chest: She is in respiratory distress.  She has wheezes. She has rales. She exhibits tenderness.  Patient is mild to moderate respiratory distress, able to speak phrases in short sentences, pulse oximetry hypoxic on baseline 3 L 88%, pulse oximetry improved to 90s with nasal cannula oxygen, diffuse inspiratory and expiratory wheezes with right-sided crackles as well with mild retractions mild accessory muscle usage; reproducible right lateral chest wall tenderness without rash without deformity without subcutaneous emphysema  Abdominal: Soft. She exhibits no distension. There is no tenderness. There is no rebound.  Musculoskeletal: She exhibits edema. She exhibits no tenderness.  Baseline ROM, no obvious  new focal weakness. Mild baseline lower leg edema bilateral.  Neurological: She is alert.  Mental status and motor strength appears baseline for patient and situation.  Skin: No rash noted.  Psychiatric: She has a normal mood and affect.    ED Course  Procedures (including critical care time) Patient / Family / Caregiver understand and agree with initial ED impression and plan with expectations set for ED visit. Patient arrived with hypoxia with pulse oximetry 88% on 3 L nasal cannula oxygen, pulse oximetry improved to the 90s on 4 L nasal cannula oxygen, after continuous albuterol nebulizer patient ambulated a short distance to go to the bathroom but felt too short of breath and too weak for home with desaturation to the mid 80s, pulse oximetry improved again back to 97% on 4 L nasal cannula oxygen once patient was back in bed resting again; Pt requests transfer to [facility] since PCP and Pulmo docs there; [facility] Dr. Myriam Forehand contacted and requests Pt to have CTA chest r/o PE and ABG before he will accept Pt for transfer; hand-off to Dr. Rosalia Hammers with CT pnd and transfer pnd. 1625  CRITICAL CARE Performed by: Hurman Horn Total critical care time: including re-eval after continuous neb in ED. Critical care time was exclusive of separately billable procedures and treating other patients. Critical care was necessary to treat or prevent imminent or life-threatening deterioration. Critical care was time spent personally by me on the following activities: development of treatment plan with patient and/or surrogate as well as nursing, discussions with consultants, evaluation of patient's response to treatment, examination of patient, obtaining history from patient or surrogate, ordering and performing treatments and interventions, ordering and review of laboratory studies, ordering and review of radiographic studies, pulse oximetry and re-evaluation of patient's condition. Labs Review Labs Reviewed  CBC -  Abnormal; Notable for the following:    WBC 11.4 (*)    RBC 3.32 (*)    Hemoglobin 9.8 (*)    HCT 32.7 (*)    All other components within normal limits  BASIC METABOLIC PANEL - Abnormal; Notable for the following:    CO2 39 (*)    Glucose, Bld 148 (*)    GFR calc non Af Amer 57 (*)    GFR calc Af Amer 67 (*)    All other components within normal limits  PRO B NATRIURETIC PEPTIDE - Abnormal; Notable for the following:    Pro B Natriuretic peptide (BNP) 317.6 (*)    All other components within normal limits  I-STAT ARTERIAL BLOOD GAS, ED - Abnormal; Notable for the following:    pCO2 arterial 75.7 (*)    Bicarbonate 42.7 (*)    Acid-Base Excess 14.0 (*)    All other components within normal limits  TROPONIN I    Imaging Review No results found. Ct Angio Chest Pe W/cm &/or Wo Cm  07/21/2013   CLINICAL DATA:  chest  pain hypoxia r/o pe per [facility] pulmo doc  EXAM: CT ANGIOGRAPHY CHEST WITH CONTRAST  TECHNIQUE: Multidetector CT imaging of the chest was performed using the standard protocol during bolus administration of intravenous contrast. Multiplanar CT image reconstructions and MIPs were obtained to evaluate the vascular anatomy.  CONTRAST:  100mL OMNIPAQUE IOHEXOL 350 MG/ML SOLN  COMPARISON:  Portable chest radiograph 07/21/2013, chest CT dated 05/31/2012 is is for 2  FINDINGS: The thoracic inlet is unremarkable.  No mediastinal or hilar masses. No mediastinal hilar adenopathy. Heart enlarged. Atherosclerotic calcifications within the coronary vessels.  There are no filling defects within the main, lobar, or segmental pulmonary arteries. No thoracic aortic aneurysm nor dissection.  The tubular areas of low attenuation within the right upper lobe . These findings have the appearance of varicosities and/or areas of mucous plugging or fluid filling of the cyst in this region. Persistent diffuse areas of cystic bronchiectasis is appreciated. Consolidative area of increased density within the  anterior lung bases. There is diffuse prominence of the interstitial markings. Diffuse ground-glass density is appreciated throughout both lungs.  The visualized upper abdominal viscera demonstrate no gross abnormalities.  No aggressive appearing osseous lesions.  Review of the MIP images confirms the above findings.  IMPRESSION: No CT evidence of pulmonary arterial embolic disease, thoracic aortic aneurysm, nor dissection.  Chronic areas of cystic bronchiectasis again appreciated. There has been interval development of areas of consolidated density in the lung bases differential considerations are atelectasis versus infiltrates.  There has been a slight increase severity of the cystic bronchiectasis. Areas of mucous plugging. And or varicosities within the right upper lobe.   Electronically Signed   By: Salome HolmesHector  Cooper M.D.   On: 07/21/2013 17:52   Dg Chest Port 1 View  07/21/2013   CLINICAL DATA:  68 year old female with shortness of breath and chest pain  EXAM: PORTABLE CHEST - 1 VIEW  COMPARISON:  05/10/2013 and prior chest radiographs  FINDINGS: Cardiomegaly and pulmonary vascular congestion is noted.  Slightly increased interstitial opacities likely represent interstitial edema.  Chronic pulmonary opacities are again identified.  Bibasilar atelectasis with possible small effusions noted.  There is no evidence of pneumothorax.  IMPRESSION: Cardiomegaly with pulmonary vascular congestion and probable interstitial edema superimposed on chronic pulmonary changes.  Bibasilar atelectasis with possible small effusions.   Electronically Signed   By: Laveda AbbeJeff  Hu M.D.   On: 07/21/2013 14:37   EKG Interpretation   Date/Time:  Saturday July 21 2013 14:01:39 EDT Ventricular Rate:  101 PR Interval:  120 QRS Duration: 74 QT Interval:  330 QTC Calculation: 427 R Axis:   -17 Text Interpretation:  Sinus tachycardia Otherwise normal ECG No  significant change since last tracing Confirmed by [facility]  MD, Jonny RuizJOHN  [phone]([id number])  on 07/21/2013 4:24:00 PM      MDM   Final diagnoses:  COPD exacerbation    The patient appears reasonably stabilized for transfer considering the current resources, flow, and capabilities available in the ED at this time, and [facility] and/or treatment in the ED prior to transfer.    Hurman HornJohn M Bednar, MD 07/23/13 [phone]

## 2013-07-21 NOTE — ED Notes (Signed)
Pt desat to 84% on 4L [place] [facility]

## 2013-07-21 NOTE — ED Notes (Signed)
[facility] And she stated that she would call back with a bed assignment and dispatch their truck to come and pick the pt. Up.

## 2013-07-21 NOTE — ED Provider Notes (Signed)
Patient states she feels slightly improved.  Sats 100%, bp 144/83, rr22, hr 108  Reviewed abg and ct angio.  Discussed with Dr. Myriam ForehandAyiku and accepted to tele bed.  6:12 PM   Hilario Quarryanielle S Ray, MD 07/21/13 [phone]

## 2013-11-01 DEATH — deceased

## 2016-01-16 IMAGING — CR DG CHEST 1V PORT
1 series · 1 of 1 positions shown · non-contrast
Comparison: 05/10/2013 and prior chest radiographs

CLINICAL DATA: 68-year-old female with shortness of breath and
chest pain

EXAM:
PORTABLE CHEST - 1 VIEW

[view not recorded]
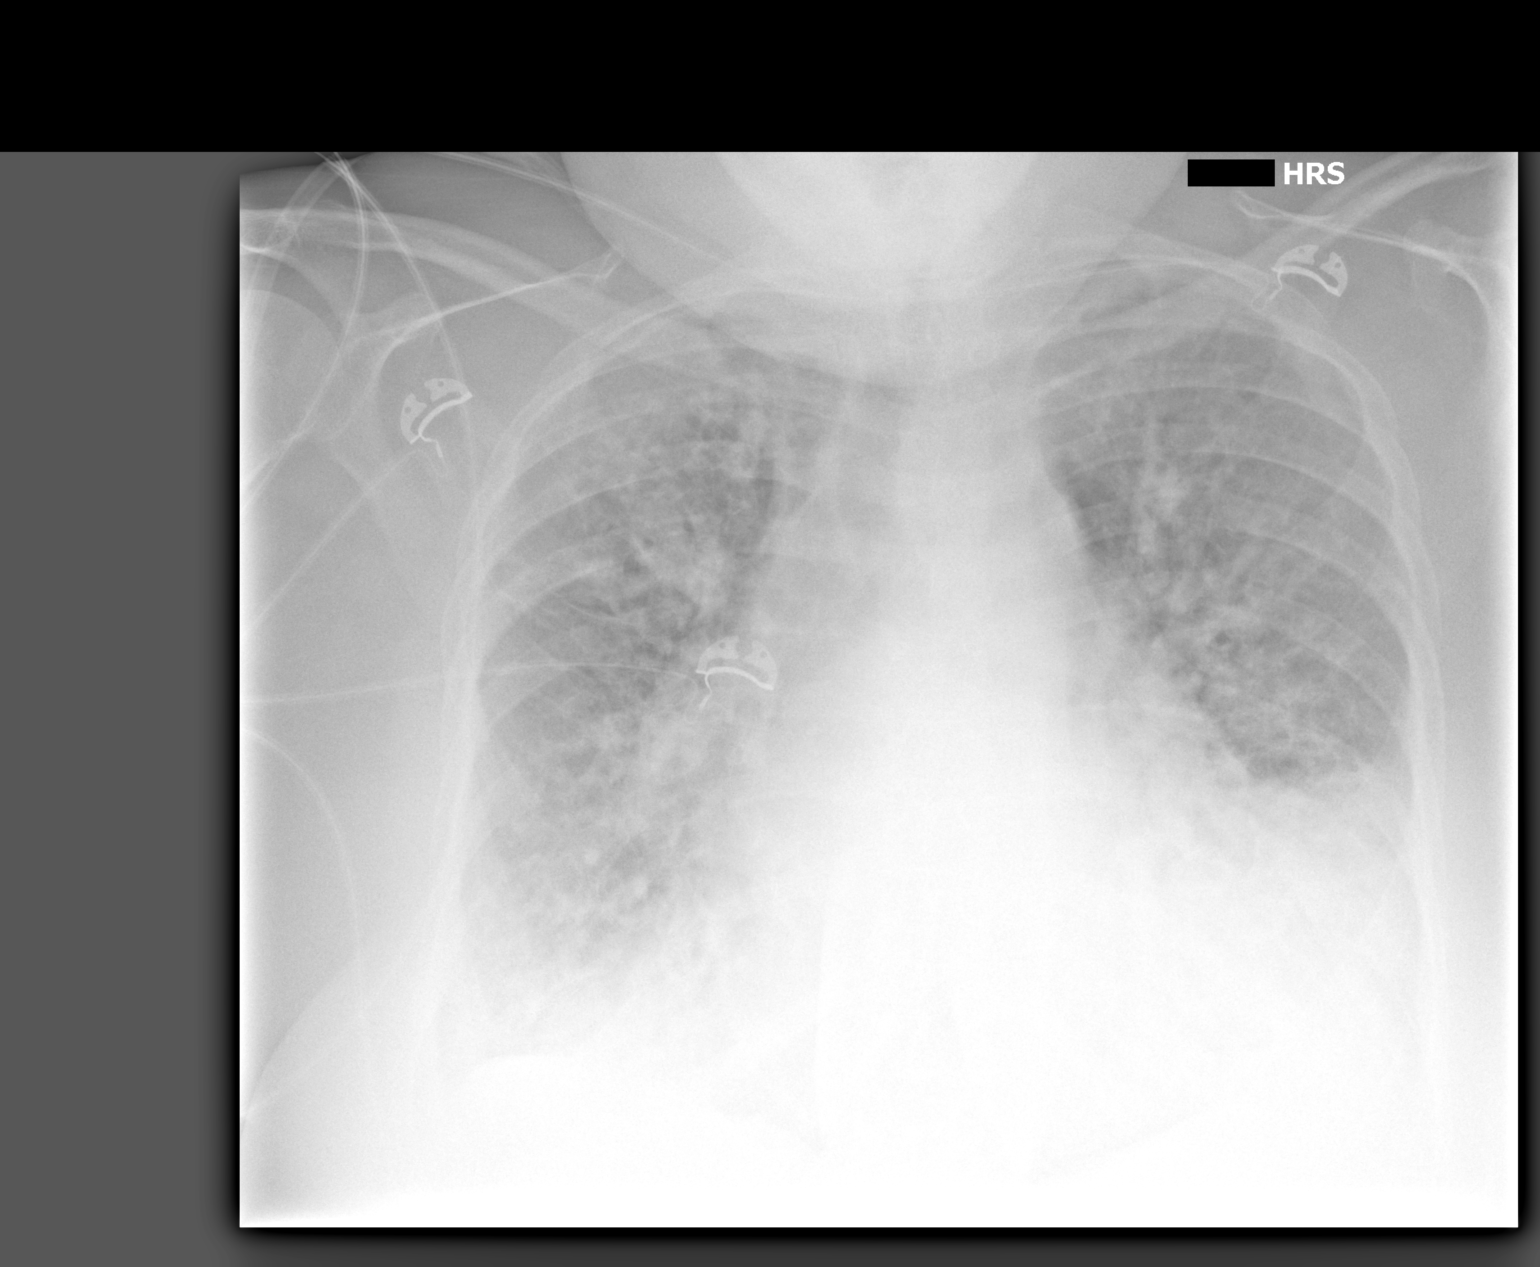

[1 of 1 positions shown; findings below may reference images not displayed]

FINDINGS: Cardiomegaly and pulmonary vascular congestion is noted.

Slightly increased interstitial opacities likely represent
interstitial edema.

Chronic pulmonary opacities are again identified.

Bibasilar atelectasis with possible small effusions noted.

There is no evidence of pneumothorax.
IMPRESSION: Cardiomegaly with pulmonary vascular congestion and probable
interstitial edema superimposed on chronic pulmonary changes.

Bibasilar atelectasis with possible small effusions.

## 2016-01-16 IMAGING — CT CT ANGIO CHEST
2 of 7 series · 18 of 36 positions shown · IV contrast (APPLIED)
Comparison: Portable chest radiograph 07/21/2013, chest CT dated
05/31/2012 is is for 2

CLINICAL DATA: chest pain hypoxia r/o pe per [facility] pulmo [REDACTED]

EXAM:
CT ANGIOGRAPHY CHEST WITH CONTRAST
TECHNIQUE: Multidetector CT imaging of the chest was performed using the
standard protocol during bolus administration of intravenous
contrast. Multiplanar CT image reconstructions and MIPs were
obtained to evaluate the vascular anatomy.
CONTRAST:  100mL OMNIPAQUE IOHEXOL 350 MG/ML SOLN

[Series 5: pe 1.0 b26f · axial · 0.66mm/px · z∈[-212,-4]mm · 17 of 232 slices shown]
[im 12/232  lung]
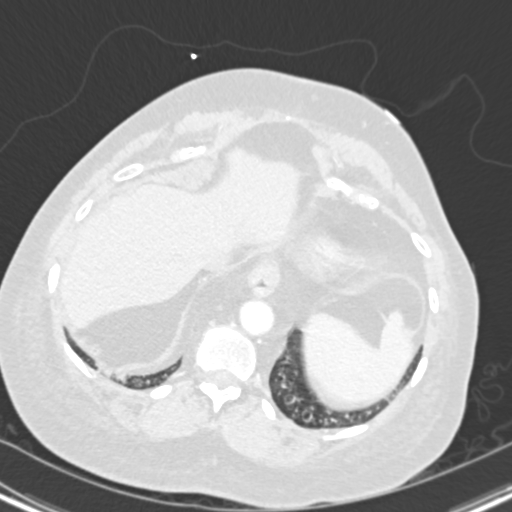
[im 24/232  mediastinal]
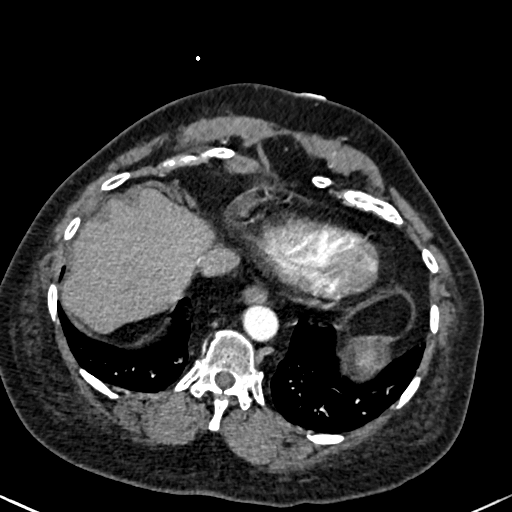
[im 35/232  lung]
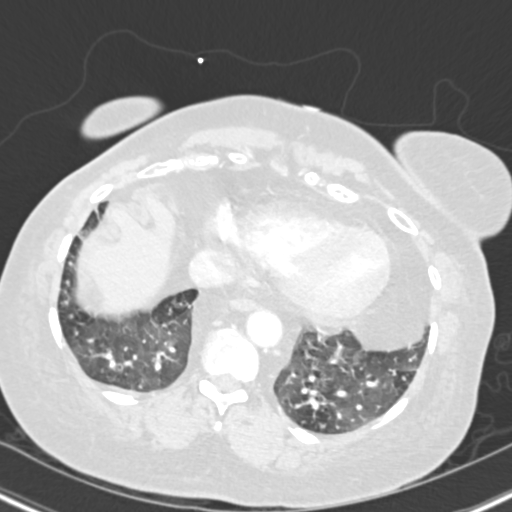
[im 47/232  mediastinal]
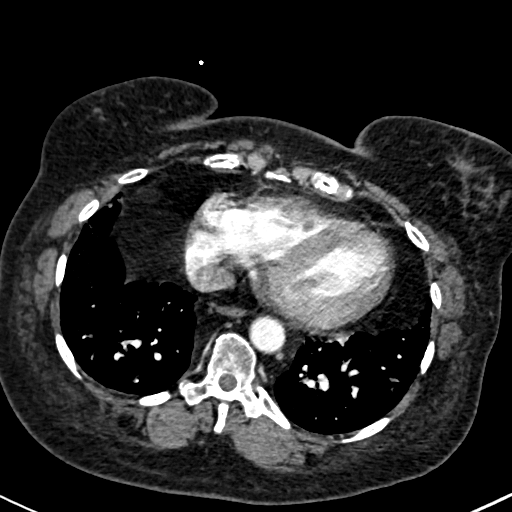
[im 70/232  lung]
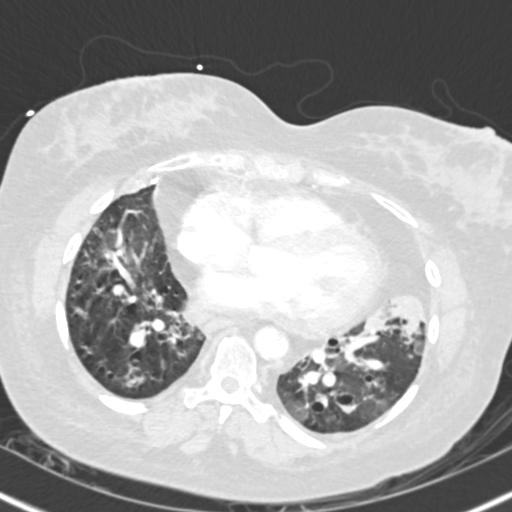
[im 81/232  mediastinal]
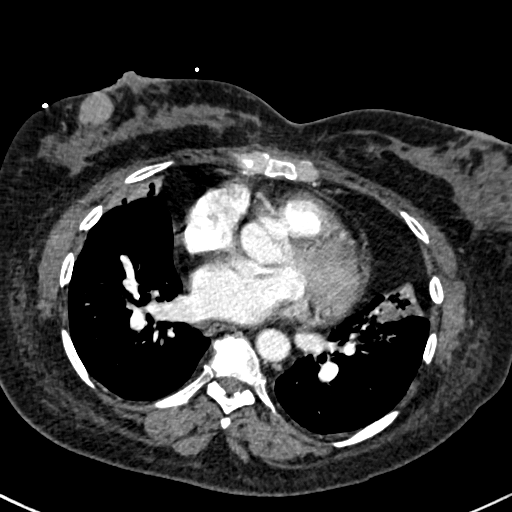
[im 93/232  lung]
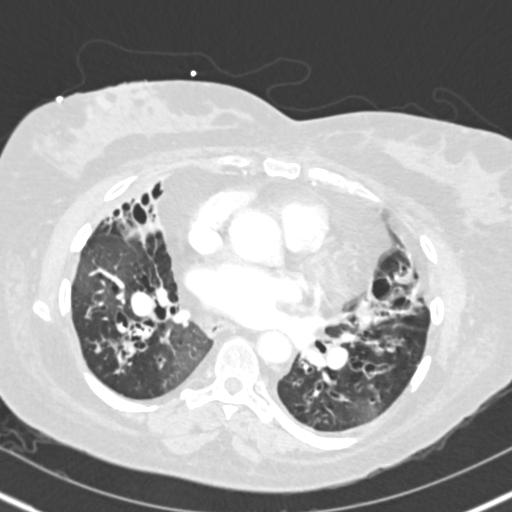
[im 104/232  mediastinal]
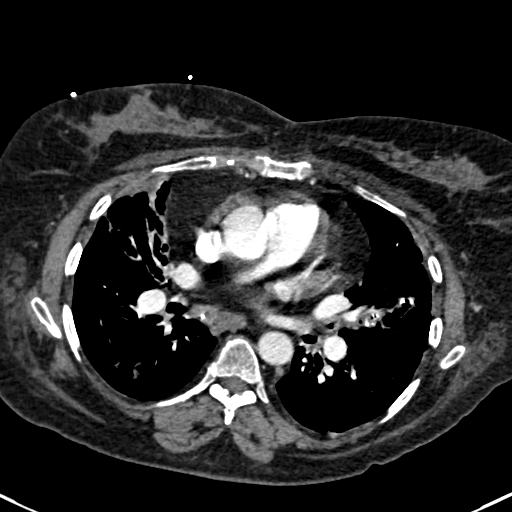
[im 116/232  lung]
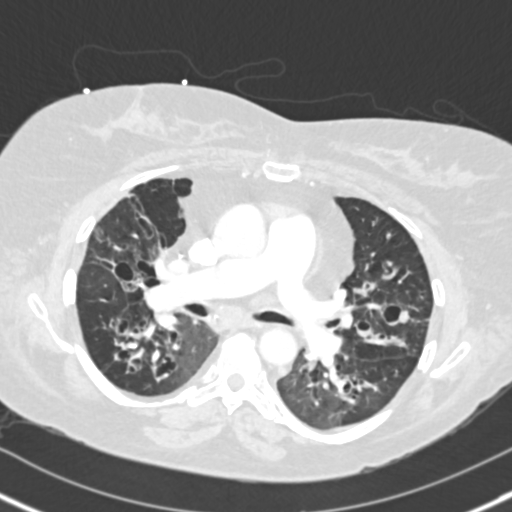
[im 128/232  mediastinal]
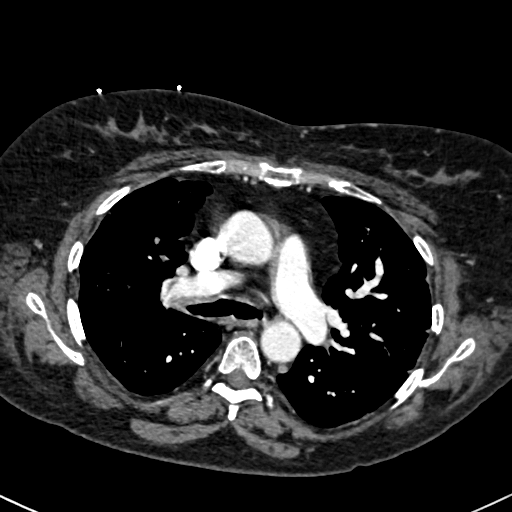
[im 139/232  lung]
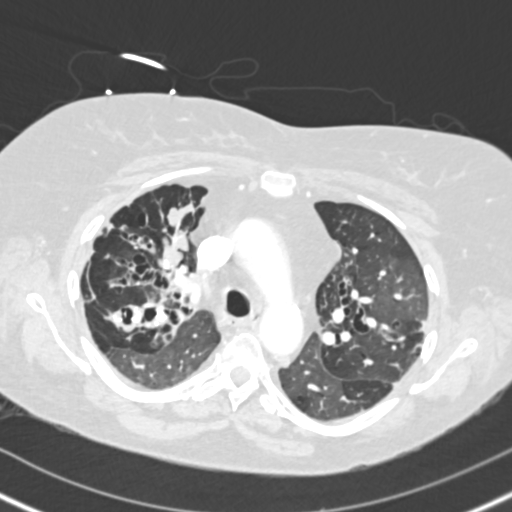
[im 151/232  mediastinal]
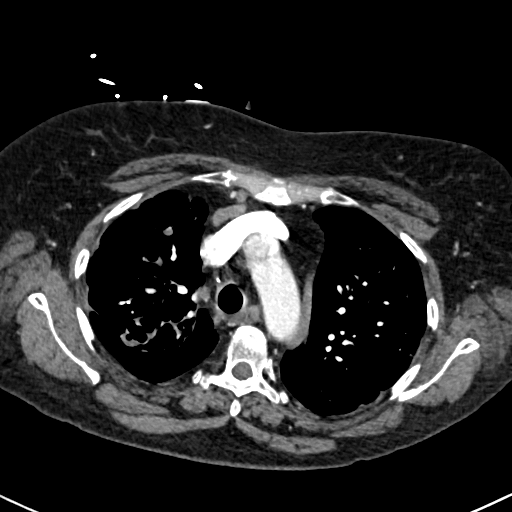
[im 162/232  lung]
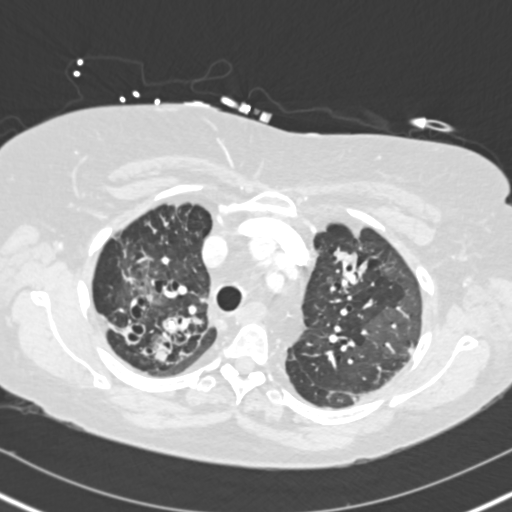
[im 185/232  mediastinal]
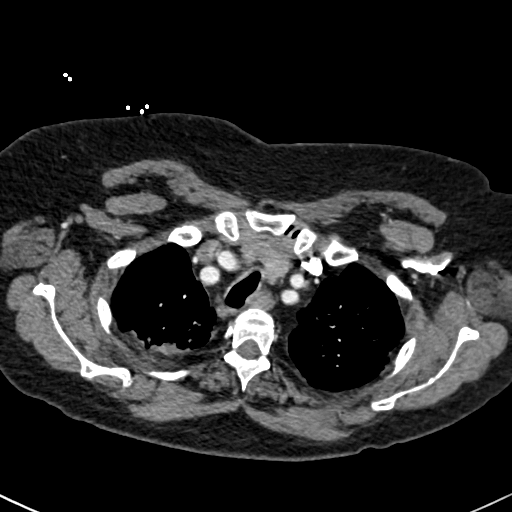
[im 197/232  lung]
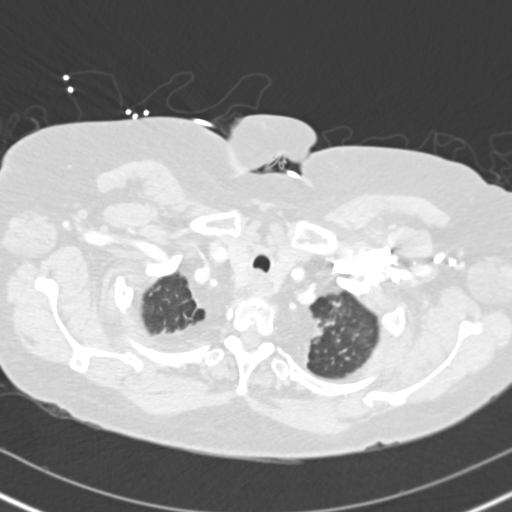
[im 208/232  mediastinal]
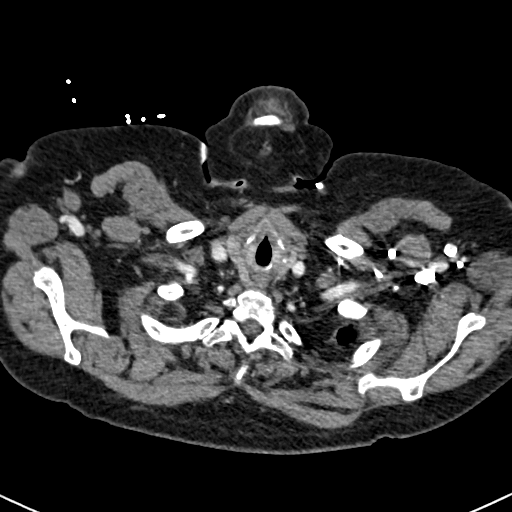
[im 220/232  lung]
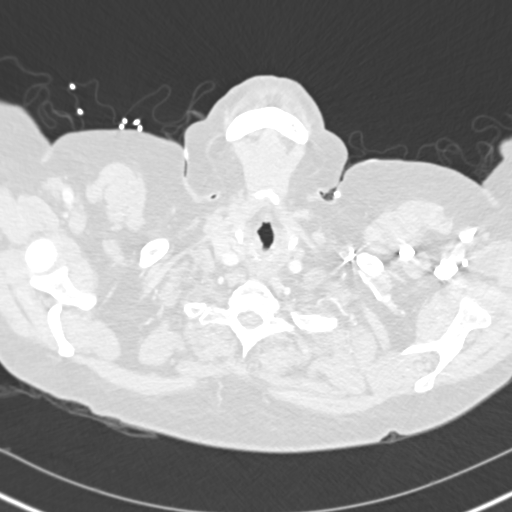

[Series 8: pe 2.0 coronal · coronal · 0.66mm/px · 1 of 97 slices shown]
[im 49/97  mediastinal]
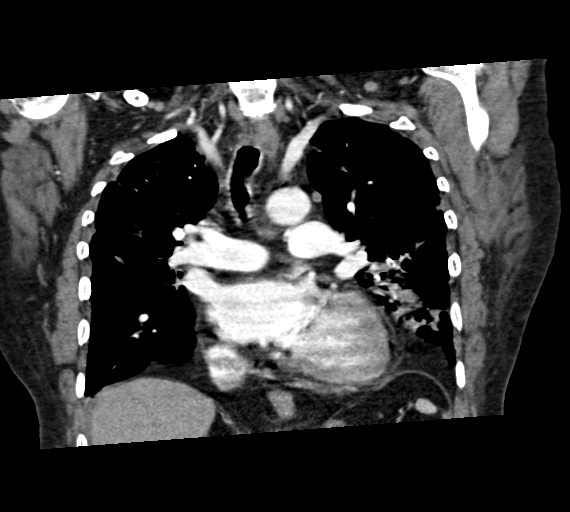

[18 of 36 positions shown; findings below may reference images not displayed]

FINDINGS: The thoracic inlet is unremarkable.

No mediastinal or hilar masses. No mediastinal hilar adenopathy.
Heart enlarged. Atherosclerotic calcifications within the coronary
vessels.

There are no filling defects within the main, lobar, or segmental
pulmonary arteries. No thoracic aortic aneurysm nor dissection.

The tubular areas of low attenuation within the right upper lobe .
These findings have the appearance of varicosities and/or areas of
mucous plugging or fluid filling of the cyst in this region.
Persistent diffuse areas of cystic bronchiectasis is appreciated.
Consolidative area of increased density within the anterior lung
bases. There is diffuse prominence of the interstitial markings.
Diffuse ground-glass density is appreciated throughout both lungs.

The visualized upper abdominal viscera demonstrate no gross
abnormalities.

No aggressive appearing osseous lesions.

Review of the MIP images confirms the above findings.
IMPRESSION: No CT evidence of pulmonary arterial embolic disease, thoracic
aortic aneurysm, nor dissection.

Chronic areas of cystic bronchiectasis again appreciated. There has
been interval development of areas of consolidated density in the
lung bases differential considerations are atelectasis versus
infiltrates.

There has been a slight increase severity of the cystic
bronchiectasis. Areas of mucous plugging. And or varicosities within
the right upper lobe.
